# Patient Record
Sex: Female | Born: 1960 | Race: White | Hispanic: No | Smoking: Current every day smoker
Health system: Southern US, Community
[De-identification: ages and names within clinical notes are randomized; demographics above are authoritative.]

## PROBLEM LIST (undated history)

## (undated) DIAGNOSIS — M419 Scoliosis, unspecified: Secondary | ICD-10-CM

## (undated) DIAGNOSIS — M810 Age-related osteoporosis without current pathological fracture: Secondary | ICD-10-CM

## (undated) HISTORY — DX: Age-related osteoporosis without current pathological fracture: M81.0

## (undated) HISTORY — DX: Scoliosis, unspecified: M41.9

---
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
## 2003-04-07 HISTORY — PX: BREAST SURGERY: SHX581

## 2008-08-23 ENCOUNTER — Encounter: Admission: RE | Admit: 2008-08-23 | Discharge: 2008-08-23 | Payer: Self-pay | Admitting: Obstetrics and Gynecology

## 2010-04-28 ENCOUNTER — Encounter: Payer: Self-pay | Admitting: Obstetrics and Gynecology

## 2013-07-04 ENCOUNTER — Other Ambulatory Visit: Payer: Self-pay | Admitting: Obstetrics and Gynecology

## 2013-07-04 DIAGNOSIS — R928 Other abnormal and inconclusive findings on diagnostic imaging of breast: Secondary | ICD-10-CM

## 2013-07-07 ENCOUNTER — Ambulatory Visit
Admission: RE | Admit: 2013-07-07 | Discharge: 2013-07-07 | Disposition: A | Discharge status: Home / Self Care | Payer: BC Managed Care – PPO | Source: Ambulatory Visit | Attending: Obstetrics and Gynecology | Admitting: Obstetrics and Gynecology

## 2013-07-07 DIAGNOSIS — R928 Other abnormal and inconclusive findings on diagnostic imaging of breast: Secondary | ICD-10-CM

## 2014-01-05 ENCOUNTER — Ambulatory Visit (INDEPENDENT_AMBULATORY_CARE_PROVIDER_SITE_OTHER): Payer: BC Managed Care – PPO | Admitting: Internal Medicine

## 2014-01-05 ENCOUNTER — Other Ambulatory Visit (INDEPENDENT_AMBULATORY_CARE_PROVIDER_SITE_OTHER): Payer: BC Managed Care – PPO

## 2014-01-05 ENCOUNTER — Encounter: Payer: Self-pay | Admitting: Internal Medicine

## 2014-01-05 VITALS — BP 120/78 | HR 86 | Temp 97.5°F | Resp 16 | Ht 71.0 in | Wt 146.0 lb

## 2014-01-05 DIAGNOSIS — Z1211 Encounter for screening for malignant neoplasm of colon: Secondary | ICD-10-CM

## 2014-01-05 DIAGNOSIS — Z720 Tobacco use: Secondary | ICD-10-CM

## 2014-01-05 DIAGNOSIS — M4120 Other idiopathic scoliosis, site unspecified: Secondary | ICD-10-CM

## 2014-01-05 DIAGNOSIS — Z0000 Encounter for general adult medical examination without abnormal findings: Secondary | ICD-10-CM | POA: Insufficient documentation

## 2014-01-05 DIAGNOSIS — F17200 Nicotine dependence, unspecified, uncomplicated: Secondary | ICD-10-CM | POA: Insufficient documentation

## 2014-01-05 LAB — LIPID PANEL
CHOL/HDL RATIO: 2
Cholesterol: 195 mg/dL (ref 0–200)
HDL: 90.8 mg/dL (ref >39.00)
LDL CALC: 100 mg/dL — AB (ref 0–99)
NonHDL: 104.2
TRIGLYCERIDES: 21 mg/dL (ref 0.0–149.0)
VLDL: 4.2 mg/dL (ref 0.0–40.0)

## 2014-01-05 LAB — BASIC METABOLIC PANEL
BUN: 18 mg/dL (ref 6–23)
CHLORIDE: 103 meq/L (ref 96–112)
CO2: 27 meq/L (ref 19–32)
Calcium: 9 mg/dL (ref 8.4–10.5)
Creatinine, Ser: 0.7 mg/dL (ref 0.4–1.2)
GFR: 100.1 mL/min (ref >60.00)
Glucose, Bld: 86 mg/dL (ref 70–99)
POTASSIUM: 3.7 meq/L (ref 3.5–5.1)
SODIUM: 137 meq/L (ref 135–145)

## 2014-01-05 NOTE — Patient Instructions (Signed)
We will send you to an orthopedic doctor to check on your spine and the nerves in your upper back. Until that time you can use heating pads on the area and for pain you can use naproxen.   We will also check some blood work today for cholesterol. We will send you to the GI doctor for the colon cancer screening.   Colorectal Cancer Screening Colorectal cancer screening is done to detect early disease. Colorectal refers to the colon and rectum. The colon and rectum are located at the end of the large intestine (digestive system), and carry your bowel movements out of the body. Screening may be done even if you are not experiencing symptoms.  Colorectal cancer screening checks for:  Polyps. These are small growths in the lining of the colon that can turn cancerous.  Cancer that is already growing. Cancer is a cluster of abnormal cells that can cause problems in the body. REASONS FOR COLORECTAL CANCER SCREENING  It is common for polyps to form in the lining of the colon, especially in older people. These polyps can be cancerous or become cancerous.  Caught early, colorectal cancer is treatable.  Cancer can be life threatening. Detecting or preventing cancer early can save your life and allow you to enjoy life longer. TYPES OF SCREENING  Fecal occult blood testing. A stool sample is examined for blood in the laboratory.  Sigmoidoscopy. A sigmoidoscope is used to examine the rectum and lower colon. A sigmoidoscope is a flexible tube with a camera that is inserted through your anus to examine your lower rectum.  Colonoscopy. The longer colonoscope is used to examine the entire colon. A colonoscope is also a thin, flexible tube with a camera. This test examines the colon and rectum. Other tests include:  Digital rectal exam.  Barium enema.  Stool DNA test.  Virtual colonoscopy is the use of computerized X-ray scan (computed tomography, CT) to take X-ray images of your colon. WHO SHOULD HAVE  COLORECTAL CANCER SCREENING?  Screening is recommended for all adults aged 51 to 75 years.  Screening is generally done every 5 to 10 years or more frequently if you have a family history or symptoms.  Screening is rarely recommended in adults aged 51 to 85 years. Screening is not recommended in adults aged 51 years and older. Your caregiver may recommend screening at a younger age and more frequent screening if you have:  A history of colorectal cancer or polyps.  Family members with histories of colorectal cancer or polyps.  Inflammatory bowel disease, such as ulcerative colitis or Crohn's disease.  A type of hereditary colon cancer syndrome. Talk with your caregiver about any symptoms, personal and family history. SYMPTOMS OF COLORECTAL CANCER It is important to discuss the following symptoms with your caregiver. These symptoms may be the result of other conditions and may be easily treated:  Rectal bleeding.  Blood in your stool.  Changes in bowel movements (hard or loose stools). These changes may last several weeks.  Abdominal cramping.  Feeling the pressure to have a bowel movement when there is no bowel movement.  Feeling tired or weak.  Unexplained weight loss.  Unexplained low red blood cell count. This may also be called iron deficiency anemia. HOME CARE INSTRUCTIONS   Follow up with your caregiver as directed.  Follow all instructions for preparation before your test as well as after. PREVENTION  Following healthy lifestyle habits each day can reduce your chance of getting colorectal cancer and many  other types of cancer:  Eat a healthy, well-balanced diet rich in fruits and vegetables and low in fats, sugars and cholesterol.  Stay active. Try to exercise at least 4 to 6 times per week for 30 minutes.  Maintain a healthy weight. Ask your caregiver what a healthy weight range is for you.  Women should only drink 1 alcoholic drink per day. Men should only drink  2 alcoholic drinks per day.  Quit smoking. SEEK MEDICAL CARE IF:   You experience abdominal or rectal symptoms (see Symptoms of Colorectal Cancer).  Your gastrointestinal issues (constipation, diarrhea) do not go away as expected.  You have questions or concerns. FOR MORE INFORMATION  American Academy of Family Physicians www.familydoctor.org  Centers for Disease Control and Prevention FootballExhibition.com.br  [place] Preventive Services Task Force www.uspreventiveservicestaskforce.org  American Cancer Society www.cancer.org MAKE SURE YOU:   Understand these instructions.  Will watch your condition.  Will get help right away if you are not doing well or get worse. Always follow up with your caregiver to find out the results of your tests. Not all test results may be available during your visit. If your test results are not back during the visit, make an appointment with your caregiver to find out the results. Do not assume everything is normal if you have not heard from your caregiver or the medical facility. It is important for you to follow up on all of your test results.  Document Released: 09/10/2009 Document Revised: 06/15/2011 Document Reviewed: 06/29/2013 [facility] Patient Information 2015 [place], [place]. This information is not intended to replace advice given to you by your health care provider. Make sure you discuss any questions you have with your health care provider.

## 2014-01-05 NOTE — Assessment & Plan Note (Signed)
S/P rod placement earlier in life and now having some numbness and tingling in the scapular area and also tops of her shoulders with sitting at the computer for prolonged periods. Will refer to orthopedics. Advised heat and naproxen for pain.

## 2014-01-05 NOTE — Assessment & Plan Note (Signed)
Was smoking about 1 PPD, now down to 1/2 PPD. She is trying to quit

## 2014-01-05 NOTE — Assessment & Plan Note (Signed)
Referral placed for GI for screening colonoscopy. Recent PAP smear from gynecology. Appears to be in menopause/perimenopausal at this time. 1 period since removal of IUD about 1 year ago. Recommended weight bearing activity.

## 2014-01-05 NOTE — Progress Notes (Signed)
   Subjective:    Patient ID: Kathleen Jackson, female    DOB: 11/11/1960, 53 y.o.   MRN: [id number]  HPI The patient is a 53 YO female who is coming in to establish care. She has PMH of scoliosis (s/p as a teen), tobacco abuse. She is divorced with a steady boyfriend. She does have 2 kids (20s and 7). She stopped smoking for many years but restarted and is now trying to quit again (recently an acquaintance died of lung cancer related to tobacco). She is having some tingling in her upper back and shoulder and wants to know if we can look into that. She has not had colonoscopy. She denies other complaints, chest pains, SOB, abdominal pain.   Review of Systems  Constitutional: Negative for fever, activity change, appetite change and fatigue.  HENT: Negative.   Eyes: Negative.   Respiratory: Negative for cough, chest tightness, shortness of breath and wheezing.   Cardiovascular: Negative for chest pain, palpitations and leg swelling.  Gastrointestinal: Negative for abdominal pain, diarrhea, constipation and abdominal distention.  Genitourinary: Negative.   Musculoskeletal: Positive for back pain. Negative for arthralgias, gait problem and myalgias.  Skin: Negative.   Neurological: Positive for numbness. Negative for dizziness, weakness, light-headedness and headaches.  Psychiatric/Behavioral: Negative.       Objective:   Physical Exam  Constitutional: She is oriented to person, place, and time. She appears well-developed and well-nourished. No distress.  HENT:  Head: Normocephalic and atraumatic.  Eyes: EOM are normal.  Neck: Normal range of motion.  Cardiovascular: Normal rate and regular rhythm.   Pulmonary/Chest: Effort normal and breath sounds normal. No respiratory distress. She has no wheezes. She has no rales.  Abdominal: Soft. Bowel sounds are normal. She exhibits no distension. There is no tenderness. There is no rebound.  Neurological: She is alert and oriented to person, place, and  time. Coordination normal.  Skin: Skin is warm and dry.   Filed Vitals:   01/05/14 1538  BP: 120/78  Pulse: 86  Temp: 97.5 F (36.4 C)  TempSrc: Oral  Resp: 16  Height: 5\' 11"  (1.803 m)  Weight: 146 lb (66.225 kg)  SpO2: 98%      Assessment & Plan:

## 2014-01-05 NOTE — Progress Notes (Signed)
Pre visit review using our clinic review tool, if applicable. No additional management support is needed unless otherwise documented below in the visit note. 

## 2014-01-31 ENCOUNTER — Encounter: Payer: Self-pay | Admitting: Internal Medicine

## 2017-11-02 DIAGNOSIS — L814 Other melanin hyperpigmentation: Secondary | ICD-10-CM | POA: Diagnosis not present

## 2017-11-02 DIAGNOSIS — L819 Disorder of pigmentation, unspecified: Secondary | ICD-10-CM | POA: Diagnosis not present

## 2017-11-02 DIAGNOSIS — L281 Prurigo nodularis: Secondary | ICD-10-CM | POA: Diagnosis not present

## 2017-11-02 DIAGNOSIS — L821 Other seborrheic keratosis: Secondary | ICD-10-CM | POA: Diagnosis not present

## 2017-11-17 DIAGNOSIS — Z83511 Family history of glaucoma: Secondary | ICD-10-CM | POA: Diagnosis not present

## 2017-12-19 DIAGNOSIS — J189 Pneumonia, unspecified organism: Secondary | ICD-10-CM | POA: Diagnosis not present

## 2017-12-19 DIAGNOSIS — R062 Wheezing: Secondary | ICD-10-CM | POA: Diagnosis not present

## 2018-03-10 DIAGNOSIS — M810 Age-related osteoporosis without current pathological fracture: Secondary | ICD-10-CM | POA: Diagnosis not present

## 2018-03-10 DIAGNOSIS — J069 Acute upper respiratory infection, unspecified: Secondary | ICD-10-CM | POA: Diagnosis not present

## 2018-05-13 ENCOUNTER — Encounter: Payer: Self-pay | Admitting: Family Medicine

## 2018-05-13 ENCOUNTER — Ambulatory Visit (INDEPENDENT_AMBULATORY_CARE_PROVIDER_SITE_OTHER): Payer: BLUE CROSS/BLUE SHIELD | Admitting: Family Medicine

## 2018-05-13 VITALS — BP 120/76 | HR 76 | Temp 98.4°F | Resp 12 | Ht 71.0 in | Wt 143.1 lb

## 2018-05-13 DIAGNOSIS — Z780 Asymptomatic menopausal state: Secondary | ICD-10-CM | POA: Diagnosis not present

## 2018-05-13 DIAGNOSIS — F411 Generalized anxiety disorder: Secondary | ICD-10-CM | POA: Insufficient documentation

## 2018-05-13 DIAGNOSIS — R200 Anesthesia of skin: Secondary | ICD-10-CM | POA: Diagnosis not present

## 2018-05-13 DIAGNOSIS — M419 Scoliosis, unspecified: Secondary | ICD-10-CM | POA: Diagnosis not present

## 2018-05-13 DIAGNOSIS — Z1239 Encounter for other screening for malignant neoplasm of breast: Secondary | ICD-10-CM | POA: Diagnosis not present

## 2018-05-13 DIAGNOSIS — F17200 Nicotine dependence, unspecified, uncomplicated: Secondary | ICD-10-CM

## 2018-05-13 DIAGNOSIS — F419 Anxiety disorder, unspecified: Secondary | ICD-10-CM

## 2018-05-13 DIAGNOSIS — G8929 Other chronic pain: Secondary | ICD-10-CM | POA: Insufficient documentation

## 2018-05-13 DIAGNOSIS — M549 Dorsalgia, unspecified: Secondary | ICD-10-CM

## 2018-05-13 MED ORDER — DULOXETINE HCL 20 MG PO CPEP: 20.0000 mg | ORAL_CAPSULE | Freq: Every day | ORAL | 1 refills | Status: DC

## 2018-05-13 NOTE — Assessment & Plan Note (Signed)
With radicular-like symptoms. Appointment with neurosurgeon will be arranged as requested.

## 2018-05-13 NOTE — Patient Instructions (Addendum)
A few things to remember from today's visit:   Right upper extremity numbness - Plan: DULoxetine (CYMBALTA) 20 MG capsule, MR Cervical Spine Wo Contrast  Scoliosis of thoracic spine, unspecified scoliosis type - Plan: MR Thoracic Spine Wo Contrast  Asymptomatic postmenopausal estrogen deficiency - Plan: DG Bone Density  Breast cancer screening - Plan: MM 3D SCREEN BREAST BILATERAL  Anxiety disorder, unspecified type - Plan: DULoxetine (CYMBALTA) 20 MG capsule  Upper back pain, chronic - Plan: DULoxetine (CYMBALTA) 20 MG capsule, MR Cervical Spine Wo Contrast  Today we started Cymbalta, this type of medications can increase suicidal risk. This is more prevalent among children,adolecents, and young adults with major depression or other psychiatric disorders. It can also make depression worse. Most common side effects are gastrointestinal, self limited after a few weeks: diarrhea, nausea, constipation  Or diarrhea among some.  In general it is well tolerated. We will follow closely.       Please be sure medication list is accurate. If a new problem present, please set up appointment sooner than planned today.

## 2018-05-13 NOTE — Progress Notes (Signed)
HPI:   Ms.Amanda Shoenfelt is a 58 y.o. female, who is here today to establish care.  Former PCP: N/A Last preventive routine visit: 2016.  Chronic medical problems: Scoliosis and anxiety.  5 months ago she was treated for pneumonia and 3 months later she developed "sinusitis" and cough the last for a few weeks. When CXR was done to evaluate for pneumonia, it was reported that she has "osteoporosis." She started taking calcium supplementation with vitamin D.  Upper back pain, attributed to scoliosis. Pain is radiated to right shoulder and bilateral infraclavicular area with tingling.  Occasionally RUE numbness . She is not sure about exacerbating or alleviating factors.  She also has intermittent lower back pain, no radiated, attributed to being seated all day at work. Negative for saddle anesthesia or LE numbness/tingling.  She is requesting referral to neurologist.  Anxiety: She stated that she "always" has anxiety but it has been worse for the past year or so due to stress at work. Sometimes she has difficulty remembering "stop." She is performing well but sometimes she feels "overwhelmed."  In the past she tried Wellbutrin for smoking cessation, did not tolerate it well.  Medication caused palpitations.  She took Chantix for smoking cessation, it caused depression. She denies suicidal thoughts. She lives with her boyfriend.   Review of Systems  Constitutional: Negative for activity change, appetite change, fatigue and fever.  HENT: Negative for mouth sores, nosebleeds and trouble swallowing.   Eyes: Negative for redness and visual disturbance.  Respiratory: Negative for cough, shortness of breath and wheezing.   Cardiovascular: Negative for chest pain, palpitations and leg swelling.  Gastrointestinal: Negative for abdominal pain, nausea and vomiting.       Negative for changes in bowel habits.  Genitourinary: Negative for decreased urine volume and hematuria.    Musculoskeletal: Positive for back pain and neck pain.  Neurological: Positive for numbness. Negative for syncope, weakness and headaches.  Psychiatric/Behavioral: Negative for confusion. The patient is nervous/anxious.       Current Outpatient Medications on File Prior to Visit  Medication Sig Dispense Refill  . BETA CAROTENE PO Take 10 mg by mouth daily.    . Calcium Carbonate-Vit D-Min (CALCIUM 1200 PO) Take 1,200 mg by mouth daily.    Marland Kitchen MAGNESIUM PO Take 1 tablet by mouth daily.     No current facility-administered medications on file prior to visit.      Past Medical History:  Diagnosis Date  . Osteoporosis   . Severe scoliosis    No Known Allergies  Family History  Problem Relation Age of Onset  . Diabetes Father   . Hypertension Father   . Hypertension Brother   . Depression Mother   . Hearing loss Mother   . Hearing loss Maternal Grandmother     Social History   Socioeconomic History  . Marital status: Unknown    Spouse name: Not on file  . Number of children: 2  . Years of education: Not on file  . Highest education level: Not on file  Occupational History  . Not on file  Social Needs  . Financial resource strain: Not on file  . Food insecurity:    Worry: Not on file    Inability: Not on file  . Transportation needs:    Medical: Not on file    Non-medical: Not on file  Tobacco Use  . Smoking status: Current Every Day Smoker  . Smokeless tobacco: Never Used  Substance  and Sexual Activity  . Alcohol use: Yes    Alcohol/week: 6.0 standard drinks    Types: 6 Standard drinks or equivalent per week  . Drug use: No  . Sexual activity: Yes    Birth control/protection: Condom  Lifestyle  . Physical activity:    Days per week: Not on file    Minutes per session: Not on file  . Stress: Not on file  Relationships  . Social connections:    Talks on phone: Not on file    Gets together: Not on file    Attends religious service: Not on file    Active  member of club or organization: Not on file    Attends meetings of clubs or organizations: Not on file    Relationship status: Not on file  Other Topics Concern  . Not on file  Social History Narrative  . Not on file    Vitals:   05/13/18 1440  BP: 120/76  Pulse: 76  Resp: 12  Temp: 98.4 F (36.9 C)  SpO2: 96%    Body mass index is 19.96 kg/m.   Physical Exam  Nursing note and vitals reviewed. Constitutional: She is oriented to person, place, and time. She appears well-developed and well-nourished. No distress.  HENT:  Head: Normocephalic and atraumatic.  Mouth/Throat: Oropharynx is clear and moist and mucous membranes are normal.  Eyes: Pupils are equal, round, and reactive to light. Conjunctivae are normal.  Cardiovascular: Normal rate and regular rhythm.  No murmur heard. Pulses:      Dorsalis pedis pulses are 2+ on the right side and 2+ on the left side.  Respiratory: Effort normal and breath sounds normal. No respiratory distress.  GI: Soft. She exhibits no mass. There is no hepatomegaly. There is no abdominal tenderness.  Musculoskeletal:        General: No edema.     Cervical back: She exhibits no tenderness and no bony tenderness.     Thoracic back: She exhibits tenderness and deformity. She exhibits no bony tenderness.       Back:     Comments: Thoracic scoliosis with right side prominent clavicle.  Lymphadenopathy:    She has no cervical adenopathy.  Neurological: She is alert and oriented to person, place, and time. She has normal strength. No cranial nerve deficit. Gait normal.  Skin: Skin is warm. No rash noted. No erythema.  Psychiatric: Her mood appears anxious.  Well groomed, good eye contact.      ASSESSMENT AND PLAN:   Ms. Marchelle Folks was seen today for establish care.  Orders Placed This Encounter  Procedures  . MR Cervical Spine Wo Contrast  . MR Thoracic Spine Wo Contrast  . DG Bone Density  . MM 3D SCREEN BREAST BILATERAL  . Ambulatory  referral to Neurosurgery     Anxiety disorder Pharmacologic treatment options discussed, because she also has chronic back pain, I think she will benefit from Cymbalta. She is concerned about possible side effects and would like to try a lower dose. Side effects of medication discussed. Instructed about warning signs. Follow-up in 6 weeks, before if needed.   Right upper extremity numbness ?  Cervical radiculopathy. Appointment with neurosurgery will be arranged, cervical MRI to be done before appointment. Cymbalta may also help.  Tobacco use disorder Adverse effects of tobacco use and benefits of smoking cessation discussed. She does not feel like she is ready to quit the smoking.  Scoliosis of thoracic spine With radicular-like symptoms. Appointment with neurosurgeon  will be arranged as requested.  Upper back pain, chronic Cymbalta 20 mg may help with problem. Follow-up in 6 weeks. Appointment with neurosurgeon will be arranged as requested.  Asymptomatic postmenopausal estrogen deficiency -     DG Bone Density; Future  Breast cancer screening -     MM 3D SCREEN BREAST BILATERAL; Future       We are planning fasting labs next visit.   Return in about 6 weeks (around 06/24/2018) for anxiety.      Feliz Lincoln G. [place], MD  [facility]. [place] office.

## 2018-05-13 NOTE — Assessment & Plan Note (Signed)
Adverse effects of tobacco use and benefits of smoking cessation discussed. She does not feel like she is ready to quit the smoking.

## 2018-05-13 NOTE — Assessment & Plan Note (Signed)
?    Cervical radiculopathy. Appointment with neurosurgery will be arranged, cervical MRI to be done before appointment. Cymbalta may also help.

## 2018-05-13 NOTE — Assessment & Plan Note (Signed)
Cymbalta 20 mg may help with problem. Follow-up in 6 weeks. Appointment with neurosurgeon will be arranged as requested.

## 2018-05-13 NOTE — Assessment & Plan Note (Signed)
Pharmacologic treatment options discussed, because she also has chronic back pain, I think she will benefit from Cymbalta. She is concerned about possible side effects and would like to try a lower dose. Side effects of medication discussed. Instructed about warning signs. Follow-up in 6 weeks, before if needed.

## 2018-06-02 ENCOUNTER — Ambulatory Visit
Admission: RE | Admit: 2018-06-02 | Discharge: 2018-06-02 | Disposition: A | Discharge status: Home / Self Care | Payer: BLUE CROSS/BLUE SHIELD | Source: Ambulatory Visit | Attending: Family Medicine | Admitting: Family Medicine

## 2018-06-02 DIAGNOSIS — R200 Anesthesia of skin: Secondary | ICD-10-CM

## 2018-06-02 DIAGNOSIS — M50122 Cervical disc disorder at C5-C6 level with radiculopathy: Secondary | ICD-10-CM | POA: Diagnosis not present

## 2018-06-02 DIAGNOSIS — M5414 Radiculopathy, thoracic region: Secondary | ICD-10-CM | POA: Diagnosis not present

## 2018-06-02 DIAGNOSIS — M549 Dorsalgia, unspecified: Secondary | ICD-10-CM

## 2018-06-02 DIAGNOSIS — M50123 Cervical disc disorder at C6-C7 level with radiculopathy: Secondary | ICD-10-CM | POA: Diagnosis not present

## 2018-06-02 DIAGNOSIS — G8929 Other chronic pain: Secondary | ICD-10-CM

## 2018-06-02 DIAGNOSIS — M4722 Other spondylosis with radiculopathy, cervical region: Secondary | ICD-10-CM | POA: Diagnosis not present

## 2018-06-02 DIAGNOSIS — M419 Scoliosis, unspecified: Secondary | ICD-10-CM

## 2018-06-03 ENCOUNTER — Telehealth: Payer: Self-pay | Admitting: *Deleted

## 2018-06-03 NOTE — Telephone Encounter (Signed)
I did receive report of thoracic and cervical MRI today. Already reviewed. Last visit a referral to neurosurgeon was placed. Thanks, BJ

## 2018-06-03 NOTE — Telephone Encounter (Signed)
Patient had imaging done on 06/02/2018.  Copied from CRM [id number]. Topic: General - Other >> Jun 03, 2018 11:28 AM Elliot Gault wrote: Patient inquiring about imaging results, please advise

## 2018-06-03 NOTE — Telephone Encounter (Signed)
Left vm for patient to call the office for lab results.

## 2018-06-07 NOTE — Telephone Encounter (Signed)
Patient given results on 06/03/2018.

## 2018-06-28 ENCOUNTER — Ambulatory Visit: Payer: BLUE CROSS/BLUE SHIELD | Admitting: Family Medicine

## 2018-07-15 ENCOUNTER — Ambulatory Visit: Payer: BLUE CROSS/BLUE SHIELD

## 2018-07-15 ENCOUNTER — Other Ambulatory Visit: Payer: BLUE CROSS/BLUE SHIELD

## 2018-11-03 ENCOUNTER — Telehealth: Payer: Self-pay

## 2018-11-03 NOTE — Telephone Encounter (Signed)
Copied from [place] [id number]. Topic: Appointment Scheduling - Transfer of Care >> Nov 03, 2018 11:31 AM Leward Quan A wrote: Pt is requesting to transfer FROM: Kathleen Jackson  Pt is requesting to transfer TO: Dierdre Forth Reason for requested transfer: easier to access the [facility] office  Send CRM to patient's current PCP (transferring FROM).

## 2018-11-03 NOTE — Telephone Encounter (Signed)
Dr. Jordan - Please advise on pt's request to transfer care. Thanks! 

## 2018-11-04 NOTE — Telephone Encounter (Signed)
He is fine with me. Thanks, BJ

## 2018-11-05 NOTE — Telephone Encounter (Signed)
Dr. Jonni Sanger - Pt is requesting to transfer care to you. Please advise. Thank you!

## 2018-11-07 NOTE — Telephone Encounter (Signed)
Ok. Thanks.  Please call to schedule.

## 2018-11-07 NOTE — Telephone Encounter (Signed)
I called office was told to send a message. Pt is returning [place] call and has some questions

## 2018-11-07 NOTE — Telephone Encounter (Signed)
Please contact pt to schedule TOC. 

## 2018-11-07 NOTE — Telephone Encounter (Signed)
LVM to schedule, no need to route.

## 2018-11-07 NOTE — Telephone Encounter (Signed)
Patient is scheduled, no further action required.

## 2018-11-08 ENCOUNTER — Other Ambulatory Visit: Payer: Self-pay

## 2018-11-08 ENCOUNTER — Ambulatory Visit (INDEPENDENT_AMBULATORY_CARE_PROVIDER_SITE_OTHER): Payer: BLUE CROSS/BLUE SHIELD | Admitting: Family Medicine

## 2018-11-08 ENCOUNTER — Encounter: Payer: Self-pay | Admitting: Family Medicine

## 2018-11-08 VITALS — BP 128/80 | HR 80 | Temp 98.3°F | Resp 14 | Ht 71.0 in | Wt 141.2 lb

## 2018-11-08 DIAGNOSIS — F17200 Nicotine dependence, unspecified, uncomplicated: Secondary | ICD-10-CM | POA: Diagnosis not present

## 2018-11-08 DIAGNOSIS — Z1239 Encounter for other screening for malignant neoplasm of breast: Secondary | ICD-10-CM

## 2018-11-08 DIAGNOSIS — F411 Generalized anxiety disorder: Secondary | ICD-10-CM

## 2018-11-08 DIAGNOSIS — M41113 Juvenile idiopathic scoliosis, cervicothoracic region: Secondary | ICD-10-CM

## 2018-11-08 DIAGNOSIS — E2839 Other primary ovarian failure: Secondary | ICD-10-CM

## 2018-11-08 NOTE — Patient Instructions (Signed)
Please return for your annual complete physical; please come fasting.  Start strength training, especially for your upper back and core.  Think about stress management.    It was a pleasure meeting you today! Thank you for choosing [place] to meet your healthcare needs! I truly look forward to working with you. If you have any questions or concerns, please send me a message via Mychart or call the office at [phone].   Stress Stress is a normal reaction to life events. Stress is what you feel when life demands more than you are used to, or more than you think you can handle. Some stress can be useful, such as studying for a test or meeting a deadline at work. Stress that occurs too often or for too long can cause problems. It can affect your emotional health and interfere with relationships and normal daily activities. Too much stress can weaken your body's defense system (immune system) and increase your risk for physical illness. If you already have a medical problem, stress can make it worse. What are the causes? All sorts of life events can cause stress. An event that causes stress for one person may not be stressful for another person. Major life events, whether positive or negative, commonly cause stress. Examples include:  Losing a job or starting a new job.  Losing a loved one.  Moving to a new town or home.  Getting married or divorced.  Having a baby.  Injury or illness. Less obvious life events can also cause stress, especially if they occur day after day or in combination with each other. Examples include:  Working long hours.  Driving in traffic.  Caring for children.  Being in debt.  Being in a difficult relationship. What are the signs or symptoms? Stress can cause emotional symptoms, including:  Anxiety. This is feeling worried, afraid, on edge, overwhelmed, or out of control.  Anger, including irritation or impatience.  Depression. This is feeling sad, down,  helpless, or guilty.  Trouble focusing, remembering, or making decisions. Stress can cause physical symptoms, including:  Aches and pains. These may affect your head, neck, back, stomach, or other areas of your body.  Tight muscles or a clenched jaw.  Low energy.  Trouble sleeping. Stress can cause unhealthy behaviors, including:  Eating to feel better (overeating) or skipping meals.  Working too much or putting off tasks.  Smoking, drinking alcohol, or using drugs to feel better. How is this diagnosed? Stress is diagnosed through an assessment by your health care provider. He or she may diagnose this condition based on:  Your symptoms and any stressful life events.  Your medical history.  Tests to rule out other causes of your symptoms. Depending on your condition, your health care provider may refer you to a specialist for further evaluation. How is this treated?  Stress management techniques are the recommended treatment for stress. Medicine is not typically recommended for the treatment of stress. Techniques to reduce your reaction to stressful life events include:  Stress identification. Monitor yourself for symptoms of stress and identify what causes stress for you. These skills may help you to avoid or prepare for stressful events.  Time management. Set your priorities, keep a calendar of events, and learn to say "no." Taking these actions can help you avoid making too many commitments. Techniques for coping with stress include:  Rethinking the problem. Try to think realistically about stressful events rather than ignoring them or overreacting. Try to find the positives in  a stressful situation rather than focusing on the negatives.  Exercise. Physical exercise can release both physical and emotional tension. The key is to find a form of exercise that you enjoy and do it regularly.  Relaxation techniques. These relax the body and mind. The key is to find one or more  that you enjoy and use the technique(s) regularly. Examples include: ? Meditation, deep breathing, or progressive relaxation techniques. ? Yoga or tai chi. ? Biofeedback, mindfulness techniques, or journaling. ? Listening to music, being out in nature, or participating in other hobbies.  Practicing a healthy lifestyle. Eat a balanced diet, drink plenty of water, limit or avoid caffeine, and get plenty of sleep.  Having a strong support network. Spend time with family, friends, or other people you enjoy being around. Express your feelings and talk things over with someone you trust. Counseling or talk therapy with a mental health professional may be helpful if you are having trouble managing stress on your own. Follow these instructions at home: Lifestyle   Avoid drugs.  Do not use any products that contain nicotine or tobacco, such as cigarettes and e-cigarettes. If you need help quitting, ask your health care provider.  Limit alcohol intake to no more than 1 drink a day for nonpregnant women and 2 drinks a day for men. One drink equals 12 oz of beer, 5 oz of wine, or 1 oz of hard liquor.  Do not use alcohol or drugs to relax.  Eat a balanced diet that includes fresh fruits and vegetables, whole grains, lean meats, fish, eggs, and beans, and low-fat dairy. Avoid processed foods and foods high in added fat, sugar, and salt.  Exercise at least 30 minutes on 5 or more days each week.  Get 7-8 hours of sleep each night. General instructions   Practice stress management techniques as discussed with your health care provider.  Drink enough fluid to keep your urine clear or pale yellow.  Take over-the-counter and prescription medicines only as told by your health care provider.  Keep all follow-up visits as told by your health care provider. This is important. Contact a health care provider if:  Your symptoms get worse.  You have new symptoms.  You feel overwhelmed by your  problems and can no longer manage them on your own. Get help right away if:  You have thoughts of hurting yourself or others. If you ever feel like you may hurt yourself or others, or have thoughts about taking your own life, get help right away. You can go to your nearest emergency department or call:  Your local emergency services (911 in the [place]  A suicide crisis helpline, such as the [facility] at (334)146-6586. This is open 24 hours a day. Summary  Stress is a normal reaction to life events. It can cause problems if it happens too often or for too long.  Practicing stress management techniques is the best way to treat stress.  Counseling or talk therapy with a mental health professional may be helpful if you are having trouble managing stress on your own. This information is not intended to replace advice given to you by your health care provider. Make sure you discuss any questions you have with your health care provider. Document Released: 09/16/2000 Document Revised: 03/05/2017 Document Reviewed: 05/13/2016 Elsevier Patient Education  2020 [employer].

## 2018-11-08 NOTE — Progress Notes (Signed)
Subjective  CC:  Chief Complaint  Patient presents with  . Transitions Of Care    Dr. SwazilandJordan was previous PCP- only saw once.. Mammo and Dexa cancelled due to COVID wants to get done now  . Numbness    Had been taking Magnesium, started having numbness and tingling in arm which is now improving since stopping medication  . Anxiety    Had been taking Cymbalta, stopped due to symptoms worsening. Took medication for a few weeks    HPI: Kathleen Jackson is a 58 y.o. female who presents to [facility] [place] [facility] today to establish care with me as a new patient.   She has the following concerns or needs:  Pleasant 58 year old female here to establish care.  She has had intermittent care over the last decade or so.  She recently saw Dr. Swazil[place]Jordan in February and I reviewed that note.  Her most pressing and chronic problem is severe idiopathic scoliosis diagnosed in middle school.  She wore braces, then had back surgery with Harrington rods placed in her early 7920s.  Overall she is doing well.  However she does note that she has had some intermittent pain that she thinks is more related to stress and can feel some worsening the curvature.  She denies chest pain or shortness of breath.  She has no chronic pain.  She did have MRIs that I reviewed showing some mild inflammation in the cervical spine but no other acute changes.  She has chronic changes status post surgery.  Mild osteoarthritis.  She also admits to having chronic anxiety, perhaps an occasional panic attack or 2.  She has a stressful job and over the last 6 years or so has had some stress in her home life.  She does live with her longtime boyfriend who is suffering from some medical problems.  Her mother is ailing as well.  She has 2 grown children and 3 grandchildren.  She has no history of depression, perhaps an untreated history of general anxiety disorder.  She has failed Wellbutrin in the past, Chantix and now Cymbalta.   She is sensitive to medications.   GAD 7 : Generalized Anxiety Score 11/08/2018  Nervous, Anxious, on Edge 1  Control/stop worrying 1  Worry too much - different things 1  Trouble relaxing 1  Restless 1  Easily annoyed or irritable 2  Afraid - awful might happen 1  Total GAD 7 Score 8  Anxiety Difficulty Somewhat difficult    Health maintenance: She is overdue for complete physical.  She is due breast cancer screening, bone density screening colon cancer screening and immunizations.  She declines Tdap today.  She is a chronic long-term smoker who denies lung problems.  Due to her current stressors and anxiety she is not ready to consider quitting.  Of note, she did quit for about 8 years in the past.  Assessment  1. Juvenile idiopathic scoliosis of cervicothoracic region   2. Nicotine dependence with current use   3. GAD (generalized anxiety disorder)   4. Breast cancer screening   5. Hypoestrogenism      Plan   Scoliosis:.  Recommend strength training for upper back musculature.  Monitor for now.  No acute intervention indicated at this time.  Nicotine dependence: We will continue to monitor and help educate about the cessation in the future.  She is unlikely to be successful at this time.  She is pre-contemplative.  Likely chronic mild generalized anxiety  disorder: Discussed diagnosis.  Discussed stress management and behavioral techniques.  Reassess at next visit  Order mammogram and bone density screen.  Return for complete physical.  Follow up: Complete physical at patient's convenience Orders Placed This Encounter  Procedures  . MM DIGITAL SCREENING BILATERAL  . DG Bone Density   No orders of the defined types were placed in this encounter.    No flowsheet data found.  We updated and reviewed the patient's past history in detail and it is documented below.  Patient Active Problem List   Diagnosis Date Noted  . Right upper extremity numbness 05/13/2018  . GAD  (generalized anxiety disorder) 05/13/2018  . Idiopathic scoliosis 01/05/2014  . Nicotine dependence with current use 01/05/2014   Health Maintenance  Topic Date Due  . Hepatitis C Screening  July 15, 1960  . HIV Screening  05/14/1977  . TETANUS/TDAP  05/14/1981  . PAP SMEAR-Modifier  05/15/1983  . COLONOSCOPY  05/14/2012  . MAMMOGRAM  07/08/2015  . INFLUENZA VACCINE  11/05/2018    There is no immunization history on file for this patient. Current Meds  Medication Sig  . Calcium Carbonate-Vit D-Min (CALCIUM 1200 PO) Take 1,200 mg by mouth daily.  . multivitamin-lutein (OCUVITE-LUTEIN) CAPS capsule Take 1 capsule by mouth daily.    Allergies: Patient is allergic to wellbutrin [bupropion]. Past Medical History Patient  has a past medical history of Osteoporosis and Severe scoliosis. Past Surgical History Patient  has a past surgical history that includes Back surgery (1980) and Breast surgery (2005). Family History: Patient family history includes Arthritis in her sister; Autoimmune disease in her sister; Brain cancer in her paternal grandmother; Breast cancer in her paternal aunt; Cancer in her paternal uncle; Dementia in her father; Depression in her mother; Diabetes in her father; Glaucoma in her mother; Healthy in her daughter and daughter; Hearing loss in her maternal grandmother and mother; Hypertension in her brother, brother, and father; Lupus in her sister; Macular degeneration in her mother. Social History:  Patient  reports that she has been smoking cigarettes. She has a 35.00 pack-year smoking history. She has never used smokeless tobacco. She reports current alcohol use of about 6.0 standard drinks of alcohol per week. She reports that she does not use drugs.  Review of Systems: Constitutional: negative for fever or malaise Ophthalmic: negative for photophobia, double vision or loss of vision Cardiovascular: negative for chest pain, dyspnea on exertion, or new LE swelling  Respiratory: negative for SOB or persistent cough Gastrointestinal: negative for abdominal pain, change in bowel habits or melena Genitourinary: negative for dysuria or gross hematuria Musculoskeletal: negative for new gait disturbance or muscular weakness Integumentary: negative for new or persistent rashes Neurological: negative for TIA or stroke symptoms Psychiatric: negative for SI or delusions Allergic/Immunologic: negative for hives  Patient Care Team    Relationship Specialty Notifications Start End  Willow OraAndy, Lyon Dumont L, MD PCP - General Family Medicine  11/08/18     Objective  Vitals: BP 128/80   Pulse 80   Temp 98.3 F (36.8 C) (Oral)   Resp 14   Ht 5\' 11"  (1.803 m)   Wt 141 lb 3.2 oz (64 kg)   SpO2 98%   BMI 19.69 kg/m  General:  Well developed, well nourished, no acute distress, thin Psych:  Alert and oriented,normal mood and affect, appropriate with good insight HEENT:  Normocephalic, atraumatic, non-icteric sclera, PERRL, oropharynx is without mass or exudate, supple neck without adenopathy, mass or thyromegaly Cardiovascular:  RRR without gallop,  rub or murmur, nondisplaced PMI Respiratory:  Good breath sounds bilaterally, CTAB with normal respiratory effort MSK: Thoracic scoliosis with well-healed surgical scars present, nontender spine  skin:  Warm, no rashes or suspicious lesions noted Neurologic:    Mental status is normal. Gross motor and sensory exams are normal. Normal gait   Commons side effects, risks, benefits, and alternatives for medications and treatment plan prescribed today were discussed, and the patient expressed understanding of the given instructions. Patient is instructed to call or message via MyChart if he/she has any questions or concerns regarding our treatment plan. No barriers to understanding were identified. We discussed Red Flag symptoms and signs in detail. Patient expressed understanding regarding what to do in case of urgent or emergency  type symptoms.   Medication list was reconciled, printed and provided to the patient in AVS. Patient instructions and summary information was reviewed with the patient as documented in the AVS. This note was prepared with assistance of Dragon voice recognition software. Occasional wrong-word or sound-a-like substitutions may have occurred due to the inherent limitations of voice recognition software

## 2018-12-20 ENCOUNTER — Other Ambulatory Visit (HOSPITAL_COMMUNITY)
Admission: RE | Admit: 2018-12-20 | Discharge: 2018-12-20 | Disposition: A | Discharge status: Home / Self Care | Payer: BC Managed Care – PPO | Source: Ambulatory Visit | Attending: Family Medicine | Admitting: Family Medicine

## 2018-12-20 ENCOUNTER — Other Ambulatory Visit: Payer: Self-pay

## 2018-12-20 ENCOUNTER — Encounter: Payer: Self-pay | Admitting: Family Medicine

## 2018-12-20 ENCOUNTER — Ambulatory Visit (INDEPENDENT_AMBULATORY_CARE_PROVIDER_SITE_OTHER): Payer: BC Managed Care – PPO | Admitting: Family Medicine

## 2018-12-20 ENCOUNTER — Encounter: Payer: Self-pay | Admitting: Gastroenterology

## 2018-12-20 VITALS — BP 126/70 | HR 69 | Temp 97.7°F | Resp 16 | Ht 71.0 in | Wt 140.4 lb

## 2018-12-20 DIAGNOSIS — Z1212 Encounter for screening for malignant neoplasm of rectum: Secondary | ICD-10-CM

## 2018-12-20 DIAGNOSIS — M79602 Pain in left arm: Secondary | ICD-10-CM | POA: Diagnosis not present

## 2018-12-20 DIAGNOSIS — Z0000 Encounter for general adult medical examination without abnormal findings: Secondary | ICD-10-CM | POA: Insufficient documentation

## 2018-12-20 DIAGNOSIS — F17200 Nicotine dependence, unspecified, uncomplicated: Secondary | ICD-10-CM | POA: Diagnosis not present

## 2018-12-20 DIAGNOSIS — R202 Paresthesia of skin: Secondary | ICD-10-CM

## 2018-12-20 DIAGNOSIS — M79601 Pain in right arm: Secondary | ICD-10-CM

## 2018-12-20 DIAGNOSIS — Z23 Encounter for immunization: Secondary | ICD-10-CM

## 2018-12-20 DIAGNOSIS — Z1211 Encounter for screening for malignant neoplasm of colon: Secondary | ICD-10-CM | POA: Diagnosis not present

## 2018-12-20 DIAGNOSIS — Z124 Encounter for screening for malignant neoplasm of cervix: Secondary | ICD-10-CM

## 2018-12-20 DIAGNOSIS — Z1231 Encounter for screening mammogram for malignant neoplasm of breast: Secondary | ICD-10-CM

## 2018-12-20 LAB — CBC WITH DIFFERENTIAL/PLATELET
Basophils Absolute: 0 10*3/uL (ref 0.0–0.1)
Basophils Relative: 0.6 % (ref 0.0–3.0)
Eosinophils Absolute: 0.1 10*3/uL (ref 0.0–0.7)
Eosinophils Relative: 1.7 % (ref 0.0–5.0)
HCT: 45.2 % (ref 36.0–46.0)
Hemoglobin: 15.3 g/dL — ABNORMAL HIGH (ref 12.0–15.0)
Lymphocytes Relative: 37.3 % (ref 12.0–46.0)
Lymphs Abs: 1.9 10*3/uL (ref 0.7–4.0)
MCHC: 33.8 g/dL (ref 30.0–36.0)
MCV: 93.7 fl (ref 78.0–100.0)
Monocytes Absolute: 0.4 10*3/uL (ref 0.1–1.0)
Monocytes Relative: 8.4 % (ref 3.0–12.0)
Neutro Abs: 2.6 10*3/uL (ref 1.4–7.7)
Neutrophils Relative %: 52 % (ref 43.0–77.0)
Platelets: 343 10*3/uL (ref 150.0–400.0)
RBC: 4.82 Mil/uL (ref 3.87–5.11)
RDW: 12.7 % (ref 11.5–15.5)
WBC: 5 10*3/uL (ref 4.0–10.5)

## 2018-12-20 LAB — COMPREHENSIVE METABOLIC PANEL
ALT: 19 U/L (ref 0–35)
AST: 25 U/L (ref 0–37)
Albumin: 4.8 g/dL (ref 3.5–5.2)
Alkaline Phosphatase: 56 U/L (ref 39–117)
BUN: 14 mg/dL (ref 6–23)
CO2: 30 mEq/L (ref 19–32)
Calcium: 10.7 mg/dL — ABNORMAL HIGH (ref 8.4–10.5)
Chloride: 101 mEq/L (ref 96–112)
Creatinine, Ser: 0.66 mg/dL (ref 0.40–1.20)
GFR: 92.44 mL/min (ref >60.00)
Glucose, Bld: 100 mg/dL — ABNORMAL HIGH (ref 70–99)
Potassium: 4.3 mEq/L (ref 3.5–5.1)
Sodium: 140 mEq/L (ref 135–145)
Total Bilirubin: 0.7 mg/dL (ref 0.2–1.2)
Total Protein: 7.7 g/dL (ref 6.0–8.3)

## 2018-12-20 LAB — LIPID PANEL
Cholesterol: 207 mg/dL — ABNORMAL HIGH (ref 0–200)
HDL: 108.3 mg/dL (ref >39.00)
LDL Cholesterol: 90 mg/dL (ref 0–99)
NonHDL: 98.29
Total CHOL/HDL Ratio: 2
Triglycerides: 41 mg/dL (ref 0.0–149.0)
VLDL: 8.2 mg/dL (ref 0.0–40.0)

## 2018-12-20 LAB — B12 AND FOLATE PANEL
Folate: 19.5 ng/mL (ref >5.9)
Vitamin B-12: 707 pg/mL (ref 211–911)

## 2018-12-20 LAB — TSH: TSH: 2.16 u[IU]/mL (ref 0.35–4.50)

## 2018-12-20 NOTE — Progress Notes (Signed)
Subjective  Chief Complaint  Patient presents with   Annual Exam    Fasting    HPI: Kathleen Jackson is a 58 y.o. female who presents to [facility] [place] today for a Female Wellness Visit.   Wellness Visit: annual visit with health maintenance review and exam with Pap   HM: due mammo, dexa, pap and colonoscopy. Defers tdap but will take flu shot. Healthy lifestyle.   C/o B UE paresthesias:hands go numb. Sometimes forearms too. Not at night only. No neck pain. No weakness.   Assessment  1. Annual physical exam   2. Cervical cancer screening   3. Screening for colorectal cancer   4. Encounter for screening mammogram for breast cancer   5. Paresthesia and pain of both upper extremities   6. Need for immunization against influenza      Plan  Female Wellness Visit:  Age appropriate Health Maintenance and Prevention measures were discussed with patient. Included topics are cancer screening recommendations, ways to keep healthy (see AVS) including dietary and exercise recommendations, regular eye and dental care, use of seat belts, and avoidance of moderate alcohol use and tobacco use. Screens ordered or done today  BMI: discussed patient's BMI and encouraged positive lifestyle modifications to help get to or maintain a target BMI.  HM needs and immunizations were addressed and ordered. See below for orders. See HM and immunization section for updates. Flu today  Routine labs and screening tests ordered including cmp, cbc and lipids where appropriate.  Discussed recommendations regarding Vit D and calcium supplementation (see AVS)  Start eval for paresthesias: neg carpal tunnel physical exam. Consider emg/ncv if lab tests unremarkable.   Follow up: Return in about 1 year (around 12/20/2019) for complete physical.   Orders Placed This Encounter  Procedures   MM Digital Screening   Flu Vaccine QUAD 36+ mos IM   CBC with Differential/Platelet    Comprehensive metabolic panel   Lipid panel   HIV Antibody (routine testing w rflx)   Hepatitis C antibody   TSH   B12 and Folate Panel   Ambulatory referral to Gastroenterology   No orders of the defined types were placed in this encounter.    Lifestyle: Body mass index is 19.58 kg/m. Wt Readings from Last 3 Encounters:  12/20/18 140 lb 6.4 oz (63.7 kg)  11/08/18 141 lb 3.2 oz (64 kg)  05/13/18 143 lb 2 oz (64.9 kg)   Diet: low fat Exercise: intermittently,   Patient Active Problem List   Diagnosis Date Noted   Right upper extremity numbness 05/13/2018   GAD (generalized anxiety disorder) 05/13/2018   Idiopathic scoliosis 01/05/2014   Nicotine dependence with current use 01/05/2014   Health Maintenance  Topic Date Due   Hepatitis C Screening  1961-01-19   HIV Screening  05/14/1977   PAP SMEAR-Modifier  05/15/1983   COLONOSCOPY  05/14/2012   MAMMOGRAM  07/08/2015   INFLUENZA VACCINE  11/05/2018   TETANUS/TDAP  12/20/2019 (Originally 05/14/1981)   Immunization History  Administered Date(s) Administered   Influenza,inj,Quad PF,6+ Mos 12/20/2018   We updated and reviewed the patient's past history in detail and it is documented below. Allergies: Patient is allergic to wellbutrin [bupropion]. Past Medical History Patient  has a past medical history of Osteoporosis and Severe scoliosis. Past Surgical History Patient  has a past surgical history that includes Back surgery (1980) and Breast surgery (2005). Family History: Patient family history includes Arthritis in her sister; Autoimmune disease in  her sister; Brain cancer in her paternal grandmother; Breast cancer in her paternal aunt; Cancer in her paternal uncle; Dementia in her father; Depression in her mother; Diabetes in her father; Glaucoma in her mother; Healthy in her daughter and daughter; Hearing loss in her maternal grandmother and mother; Hypertension in her brother, brother, and father; Lupus  in her sister; Macular degeneration in her mother. Social History:  Patient  reports that she has been smoking cigarettes. She has a 35.00 pack-year smoking history. She has never used smokeless tobacco. She reports current alcohol use of about 6.0 standard drinks of alcohol per week. She reports that she does not use drugs.  Review of Systems: Constitutional: negative for fever or malaise Ophthalmic: negative for photophobia, double vision or loss of vision Cardiovascular: negative for chest pain, dyspnea on exertion, or new LE swelling Respiratory: negative for SOB or persistent cough Gastrointestinal: negative for abdominal pain, change in bowel habits or melena Genitourinary: negative for dysuria or gross hematuria, no abnormal uterine bleeding or disharge Musculoskeletal: negative for new gait disturbance or muscular weakness Integumentary: negative for new or persistent rashes, no breast lumps Neurological: negative for TIA or stroke symptoms, + numbness and tingling in hands w/o weakness Psychiatric: negative for SI or delusions Allergic/Immunologic: negative for hives  Patient Care Team    Relationship Specialty Notifications Start End  Willow Ora, MD PCP - General Family Medicine  11/08/18     Objective  Vitals: BP 126/70    Pulse 69    Temp 97.7 F (36.5 C) (Tympanic)    Resp 16    Ht 5\' 11"  (1.803 m)    Wt 140 lb 6.4 oz (63.7 kg)    SpO2 96%    BMI 19.58 kg/m  General:  Well developed, well nourished, no acute distress  Psych:  Alert and orientedx3,normal mood and affect HEENT:  Normocephalic, atraumatic, non-icteric sclera, PERRL, oropharynx is clear without mass or exudate, supple neck without adenopathy, mass or thyromegaly Cardiovascular:  Normal S1, S2, RRR without gallop, rub or murmur, nondisplaced PMI Respiratory:  Good breath sounds bilaterally, CTAB with normal respiratory effort Gastrointestinal: normal bowel sounds, soft, non-tender, no noted masses. No  HSM MSK: no deformities, contusions. Joints are without erythema or swelling. Spine and CVA region are nontender Skin:  Warm, no rashes or suspicious lesions noted Neurologic:    Mental status is normal. CN 2-11 are normal. Gross motor and sensory exams are normal. Normal gait. No tremor, neg phalen's and tinel's nrml UE strength. Breast Exam: bilateral breast implants present. No mass, skin retraction or nipple discharge is appreciated in either breast. No axillary adenopathy. Fibrocystic changes are not noted Pelvic Exam: Normal external genitalia, no vulvar or vaginal lesions present. Clear cervix w/o CMT. Bimanual exam reveals a nontender fundus w/o masses, nl size. No adnexal masses present. No inguinal adenopathy. A PAP smear was performed.   Commons side effects, risks, benefits, and alternatives for medications and treatment plan prescribed today were discussed, and the patient expressed understanding of the given instructions. Patient is instructed to call or message via MyChart if he/she has any questions or concerns regarding our treatment plan. No barriers to understanding were identified. We discussed Red Flag symptoms and signs in detail. Patient expressed understanding regarding what to do in case of urgent or emergency type symptoms.   Medication list was reconciled, printed and provided to the patient in AVS. Patient instructions and summary information was reviewed with the patient as documented in the  AVS. This note was prepared with assistance of Dragon voice recognition software. Occasional wrong-word or sound-a-like substitutions may have occurred due to the inherent limitations of voice recognition software

## 2018-12-20 NOTE — Patient Instructions (Signed)
Please return in 12 months for your annual complete physical; please come fasting.  I will release your lab results to you on your MyChart account with further instructions. Please reply with any questions.   Today you were given your flu vaccination.   I've placed referrals for mammogram and Bone density testing at the [facility]. I've placed a referral to GI for your colonoscopy. Please let me know if you don't hear about these appointments within the next 2 weeks.   If you have any questions or concerns, please don't hesitate to send me a message via MyChart or call the office at [phone]. Thank you for visiting with Kathleen Jackson today! It's our pleasure caring for you.   Preventive Care 28-24 Years Old, Female Preventive care refers to visits with your health care provider and lifestyle choices that can promote health and wellness. This includes:  A yearly physical exam. This may also be called an annual well check.  Regular dental visits and eye exams.  Immunizations.  Screening for certain conditions.  Healthy lifestyle choices, such as eating a healthy diet, getting regular exercise, not using drugs or products that contain nicotine and tobacco, and limiting alcohol use. What can I expect for my preventive care visit? Physical exam Your health care provider will check your:  Height and weight. This may be used to calculate body mass index (BMI), which tells if you are at a healthy weight.  Heart rate and blood pressure.  Skin for abnormal spots. Counseling Your health care provider may ask you questions about your:  Alcohol, tobacco, and drug use.  Emotional well-being.  Home and relationship well-being.  Sexual activity.  Eating habits.  Work and work Statistician.  Method of birth control.  Menstrual cycle.  Pregnancy history. What immunizations do I need?  Influenza (flu) vaccine  This is recommended every year. Tetanus, diphtheria, and pertussis (Tdap)  vaccine  You may need a Td booster every 10 years. Varicella (chickenpox) vaccine  You may need this if you have not been vaccinated. Zoster (shingles) vaccine  You may need this after age 48. Measles, mumps, and rubella (MMR) vaccine  You may need at least one dose of MMR if you were born in 1957 or later. You may also need a second dose. Pneumococcal conjugate (PCV13) vaccine  You may need this if you have certain conditions and were not previously vaccinated. Pneumococcal polysaccharide (PPSV23) vaccine  You may need one or two doses if you smoke cigarettes or if you have certain conditions. Meningococcal conjugate (MenACWY) vaccine  You may need this if you have certain conditions. Hepatitis A vaccine  You may need this if you have certain conditions or if you travel or work in places where you may be exposed to hepatitis A. Hepatitis B vaccine  You may need this if you have certain conditions or if you travel or work in places where you may be exposed to hepatitis B. Haemophilus influenzae type b (Hib) vaccine  You may need this if you have certain conditions. Human papillomavirus (HPV) vaccine  If recommended by your health care provider, you may need three doses over 6 months. You may receive vaccines as individual doses or as more than one vaccine together in one shot (combination vaccines). Talk with your health care provider about the risks and benefits of combination vaccines. What tests do I need? Blood tests  Lipid and cholesterol levels. These may be checked every 5 years, or more frequently if you are over  51 years old.  Hepatitis C test.  Hepatitis B test. Screening  Lung cancer screening. You may have this screening every year starting at age 74 if you have a 30-pack-year history of smoking and currently smoke or have quit within the past 15 years.  Colorectal cancer screening. All adults should have this screening starting at age 37 and continuing until  age 68. Your health care provider may recommend screening at age 56 if you are at increased risk. You will have tests every 1-10 years, depending on your results and the type of screening test.  Diabetes screening. This is done by checking your blood sugar (glucose) after you have not eaten for a while (fasting). You may have this done every 1-3 years.  Mammogram. This may be done every 1-2 years. Talk with your health care provider about when you should start having regular mammograms. This may depend on whether you have a family history of breast cancer.  BRCA-related cancer screening. This may be done if you have a family history of breast, ovarian, tubal, or peritoneal cancers.  Pelvic exam and Pap test. This may be done every 3 years starting at age 31. Starting at age 78, this may be done every 5 years if you have a Pap test in combination with an HPV test. Other tests  Sexually transmitted disease (STD) testing.  Bone density scan. This is done to screen for osteoporosis. You may have this scan if you are at high risk for osteoporosis. Follow these instructions at home: Eating and drinking  Eat a diet that includes fresh fruits and vegetables, whole grains, lean protein, and low-fat dairy.  Take vitamin and mineral supplements as recommended by your health care provider.  Do not drink alcohol if: ? Your health care provider tells you not to drink. ? You are pregnant, may be pregnant, or are planning to become pregnant.  If you drink alcohol: ? Limit how much you have to 0-1 drink a day. ? Be aware of how much alcohol is in your drink. In the [place] one drink equals one 12 oz bottle of beer (355 mL), one 5 oz glass of wine (148 mL), or one 1 oz glass of hard liquor (44 mL). Lifestyle  Take daily care of your teeth and gums.  Stay active. Exercise for at least 30 minutes on 5 or more days each week.  Do not use any products that contain nicotine or tobacco, such as cigarettes,  e-cigarettes, and chewing tobacco. If you need help quitting, ask your health care provider.  If you are sexually active, practice safe sex. Use a condom or other form of birth control (contraception) in order to prevent pregnancy and STIs (sexually transmitted infections).  If told by your health care provider, take low-dose aspirin daily starting at age 4. What's next?  Visit your health care provider once a year for a well check visit.  Ask your health care provider how often you should have your eyes and teeth checked.  Stay up to date on all vaccines. This information is not intended to replace advice given to you by your health care provider. Make sure you discuss any questions you have with your health care provider. Document Released: 04/19/2015 Document Revised: 12/02/2017 Document Reviewed: 12/02/2017 Elsevier Patient Education  2020 [employer].

## 2018-12-21 LAB — HIV ANTIBODY (ROUTINE TESTING W REFLEX): HIV 1&2 Ab, 4th Generation: NONREACTIVE

## 2018-12-21 LAB — HEPATITIS C ANTIBODY
Hepatitis C Ab: NONREACTIVE
SIGNAL TO CUT-OFF: 0.34 (ref <1.00)

## 2018-12-25 LAB — CYTOLOGY - PAP
Diagnosis: NEGATIVE
High risk HPV: NEGATIVE
Molecular Disclaimer: 56
Molecular Disclaimer: DETECTED
Molecular Disclaimer: NORMAL

## 2019-01-25 ENCOUNTER — Encounter: Payer: BC Managed Care – PPO | Admitting: Gastroenterology

## 2019-08-30 ENCOUNTER — Ambulatory Visit: Payer: BC Managed Care – PPO | Attending: Internal Medicine

## 2019-08-30 DIAGNOSIS — Z23 Encounter for immunization: Secondary | ICD-10-CM

## 2019-08-30 NOTE — Progress Notes (Signed)
   Covid-[facility] Vaccination [facility]  Name:  Kathleen Jackson    MRN: [id number] DOB: [birth year]  08/30/2019  Kathleen Jackson was observed post Covid-19 immunization for 30 minutes based on pre-vaccination screening without incident. She was provided with Vaccine Information Sheet and instruction to access the V-Safe system.   Kathleen Jackson was instructed to call 911 with any severe reactions post vaccine: Marland Kitchen Difficulty breathing  . Swelling of face and throat  . A fast heartbeat  . A bad rash all over body  . Dizziness and weakness   Immunizations Administered    Name Date Dose VIS Date Route   JANSSEN COVID-19 VACCINE 08/30/2019  3:55 PM 0.5 mL 06/03/2019 Intramuscular   Manufacturer: [employer]   Lot: [id number]   NDC: 25910-289-02

## 2020-04-15 ENCOUNTER — Other Ambulatory Visit: Payer: Self-pay | Admitting: Family Medicine

## 2020-04-15 DIAGNOSIS — Z1231 Encounter for screening mammogram for malignant neoplasm of breast: Secondary | ICD-10-CM

## 2020-05-09 DIAGNOSIS — L820 Inflamed seborrheic keratosis: Secondary | ICD-10-CM | POA: Diagnosis not present

## 2020-05-09 DIAGNOSIS — D2272 Melanocytic nevi of left lower limb, including hip: Secondary | ICD-10-CM | POA: Diagnosis not present

## 2020-05-09 DIAGNOSIS — X32XXXS Exposure to sunlight, sequela: Secondary | ICD-10-CM | POA: Diagnosis not present

## 2020-05-09 DIAGNOSIS — L814 Other melanin hyperpigmentation: Secondary | ICD-10-CM | POA: Diagnosis not present

## 2020-05-24 ENCOUNTER — Ambulatory Visit
Admission: RE | Admit: 2020-05-24 | Discharge: 2020-05-24 | Disposition: A | Discharge status: Home / Self Care | Payer: BC Managed Care – PPO | Source: Ambulatory Visit | Attending: Family Medicine | Admitting: Family Medicine

## 2020-05-24 ENCOUNTER — Other Ambulatory Visit: Payer: Self-pay

## 2020-05-24 DIAGNOSIS — Z1231 Encounter for screening mammogram for malignant neoplasm of breast: Secondary | ICD-10-CM | POA: Diagnosis not present

## 2021-12-06 IMAGING — MG DIGITAL SCREENING BREAST BILAT IMPLANT W/ TOMO W/ CAD
9 of 13 series · 9 of 29 positions shown · non-contrast
Comparison: Previous exam(s).

CLINICAL DATA: Screening.

EXAM:
DIGITAL SCREENING BILATERAL MAMMOGRAM WITH IMPLANTS, CAD AND
TOMOSYNTHESIS
TECHNIQUE: Bilateral screening digital craniocaudal and mediolateral oblique
mammograms were obtained. Bilateral screening digital breast
tomosynthesis was performed. The images were evaluated with
computer-aided detection. Standard and/or implant displaced views
were performed.

[R CC (1 of 2)]
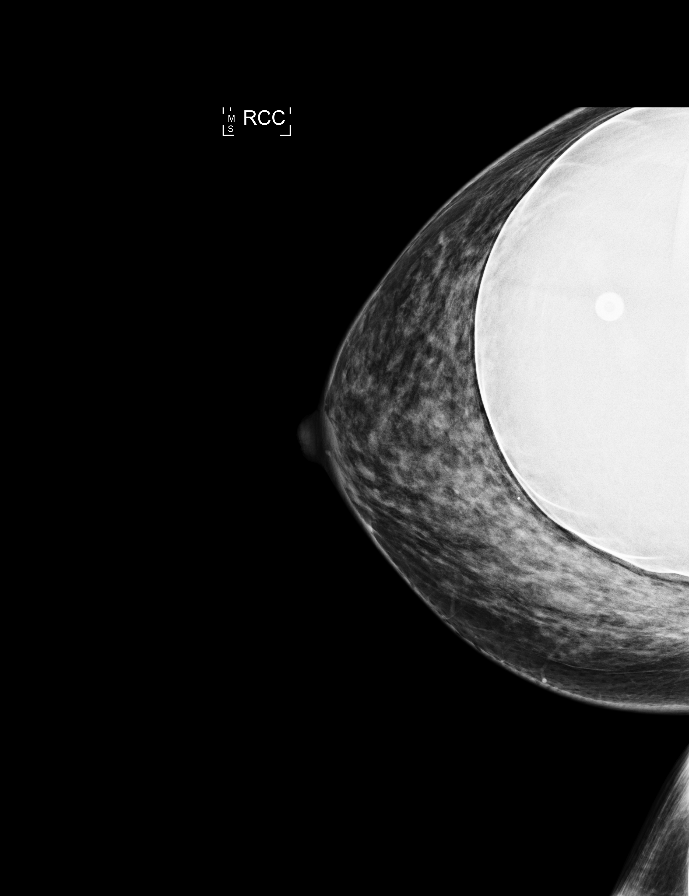

[L CC]
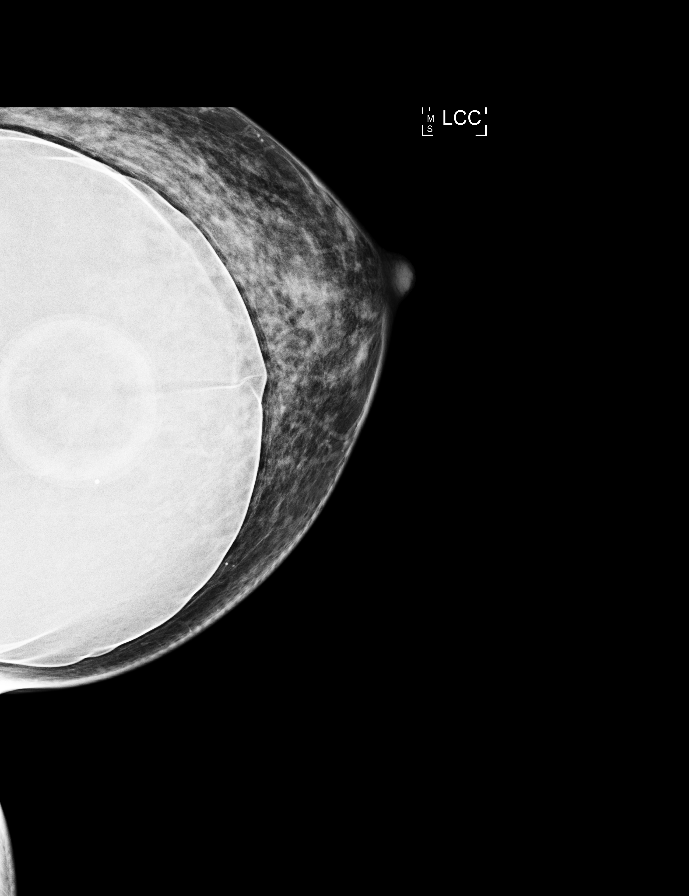

[R MLO]
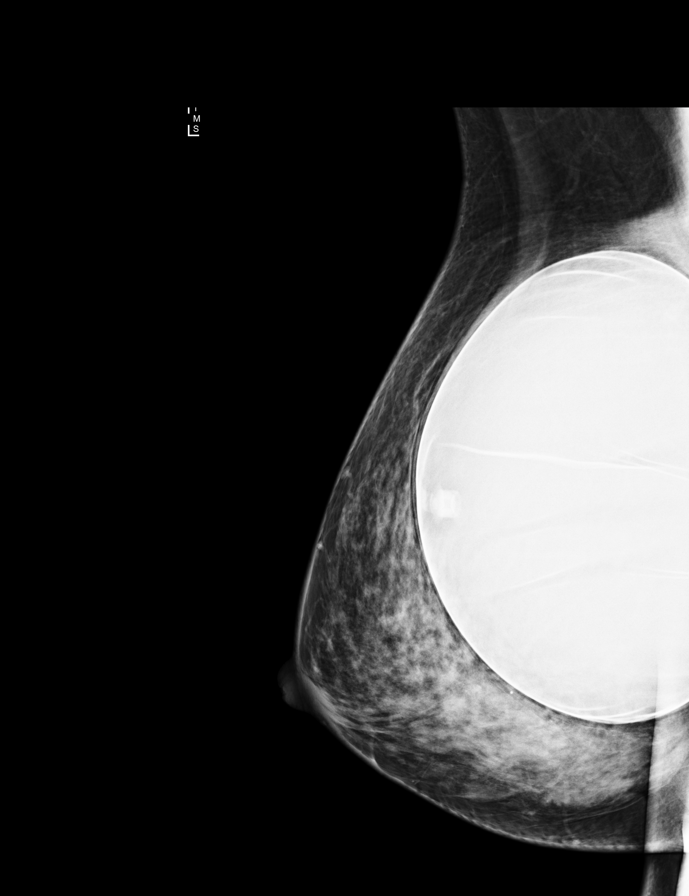

[R CC (2 of 2)]
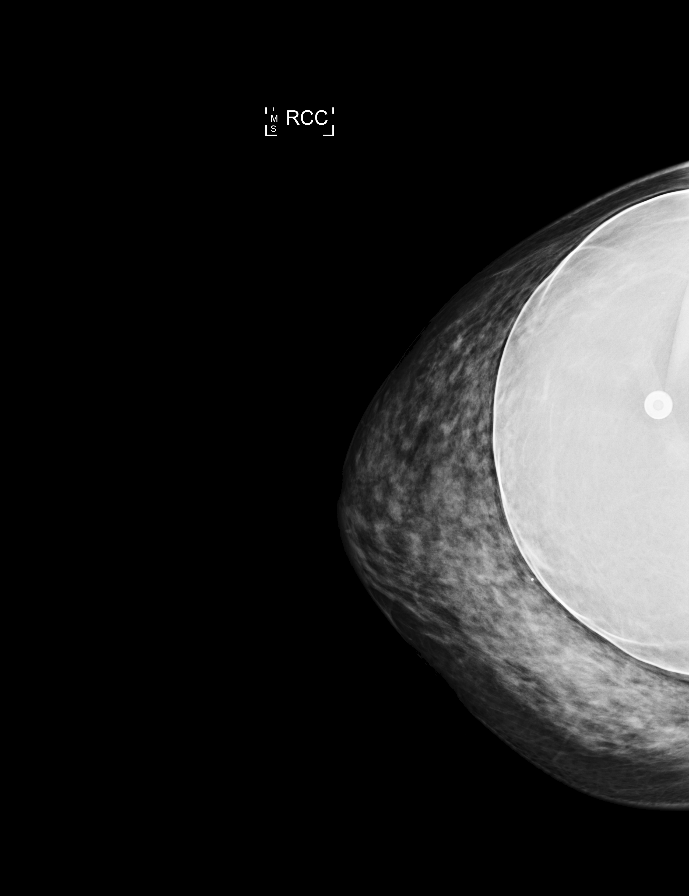

[L MLO]
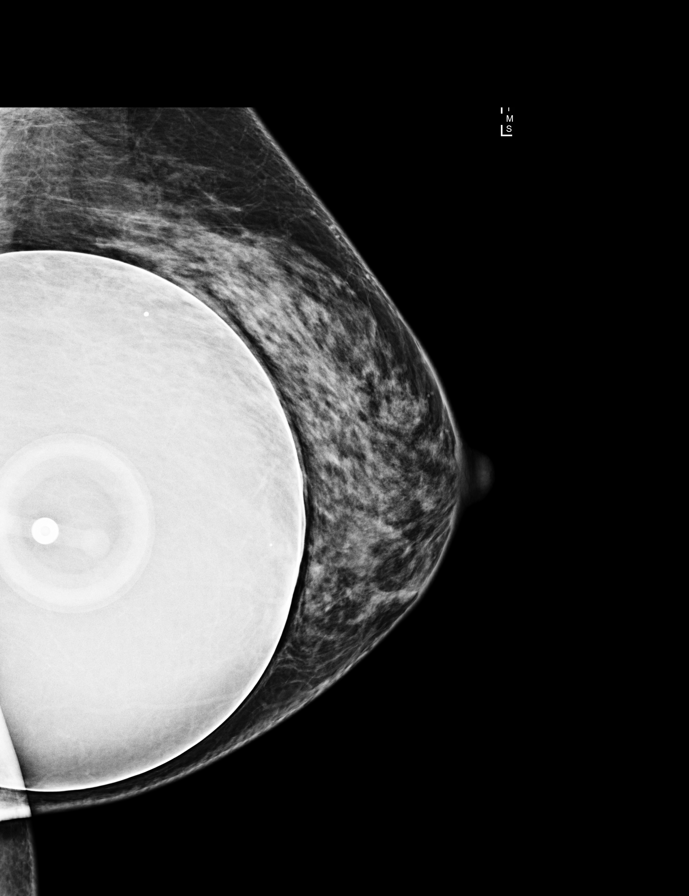

[L CC synth-2D]
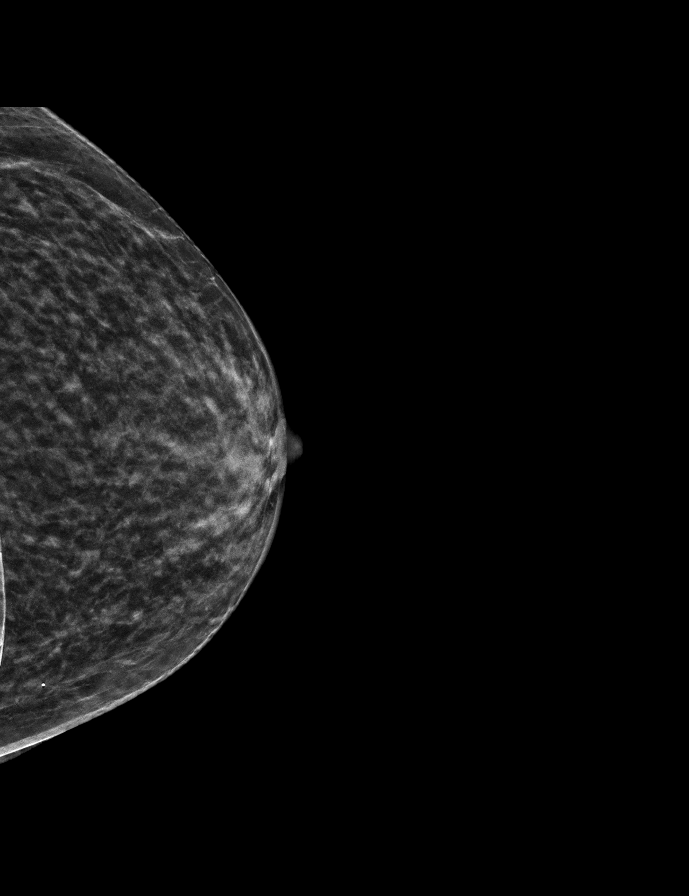

[L MLO synth-2D]
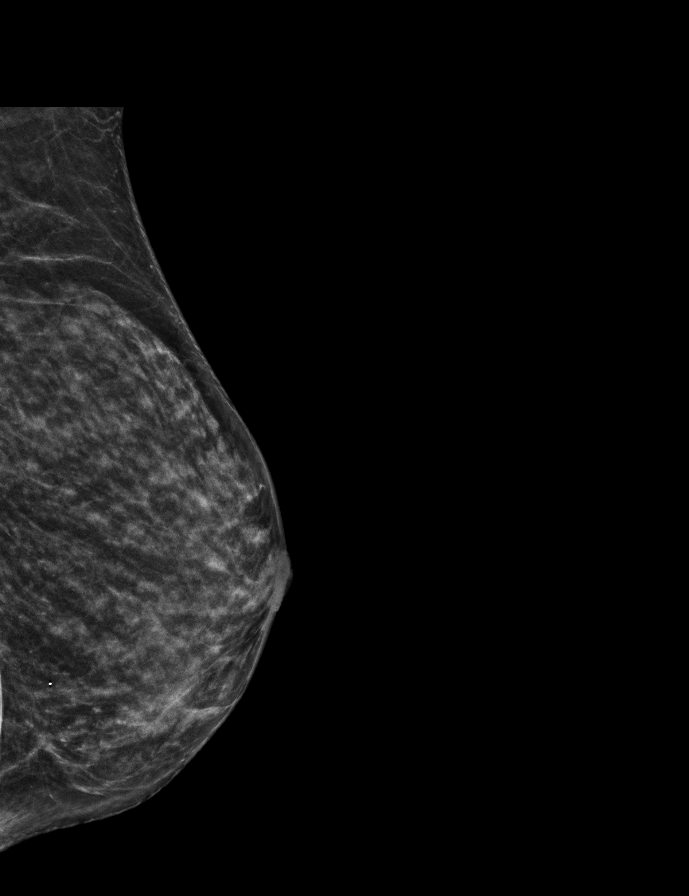

[R MLO synth-2D]
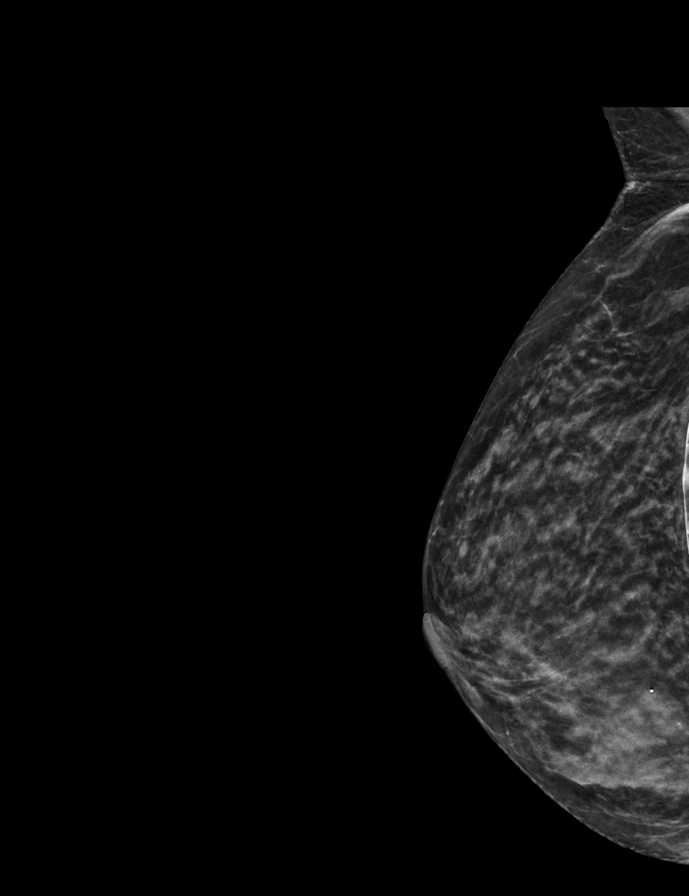

[R CC synth-2D]
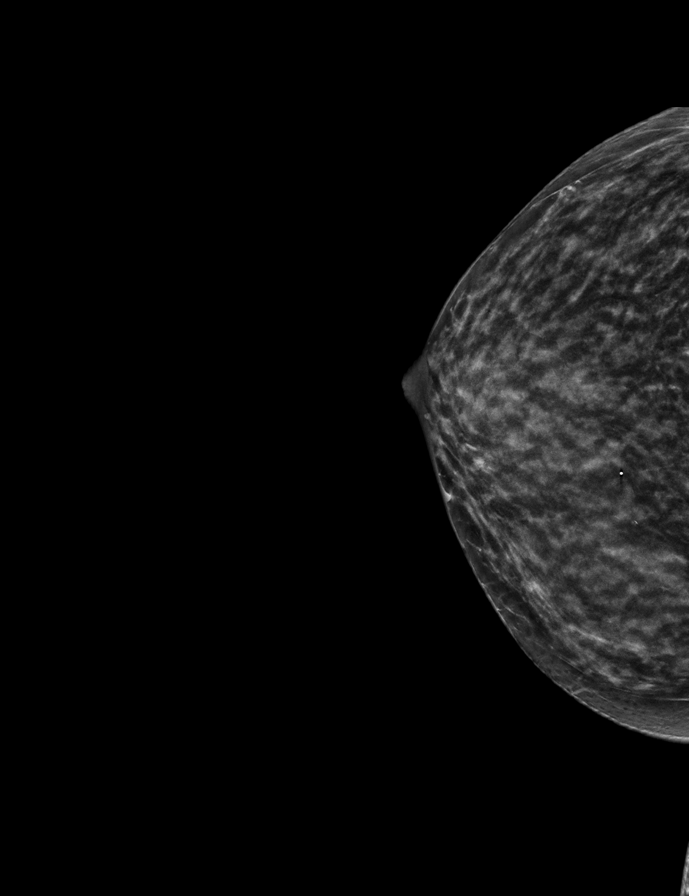

[9 of 29 positions shown; findings below may reference images not displayed]

ACR Breast Density Category c: The breast tissue is heterogeneously
dense, which may obscure small masses.
FINDINGS: The patient has retropectoral saline implants. There are no findings
suspicious for malignancy.
IMPRESSION: No mammographic evidence of malignancy. A result letter of this
screening mammogram will be mailed directly to the patient.

RECOMMENDATION:
Screening mammogram in one year. (Code:FQ-5-EOL)

BI-RADS CATEGORY  1:  Negative.

## 2021-12-29 ENCOUNTER — Encounter: Payer: Self-pay | Admitting: *Deleted

## 2022-03-19 ENCOUNTER — Encounter: Payer: Self-pay | Admitting: *Deleted
